# Patient Record
Sex: Male | Born: 1975 | Race: White | Hispanic: No | Marital: Married | Smoking: Former smoker
Health system: Southern US, Community
[De-identification: ages and names within clinical notes are randomized; demographics above are authoritative.]

---

## 2008-05-26 ENCOUNTER — Emergency Department: Payer: Self-pay | Admitting: Emergency Medicine

## 2008-05-28 ENCOUNTER — Emergency Department: Payer: Self-pay | Admitting: Emergency Medicine

## 2008-06-02 ENCOUNTER — Emergency Department: Payer: Self-pay | Admitting: Internal Medicine

## 2010-07-18 IMAGING — CT CT ABD-PELV W/O CM
1 of 2 series · 15 of 32 positions shown, 19 images · non-contrast
Comparison: none

REASON FOR EXAM: (1) L flank and LLQ pain; (2) L flank and LLQ pain,
stone protocol
COMMENTS:

[Series 2: stone · axial · 0.74mm/px · z∈[-1150,-662]mm · 15 of 177 slices shown, 19 images]
[im 7/177  soft-tissue]
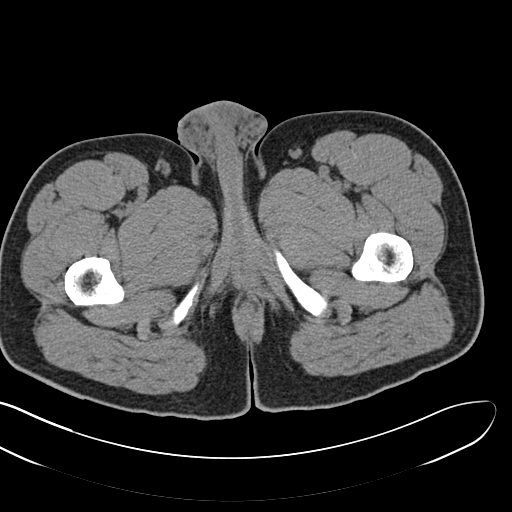
[im 7/177  bone]
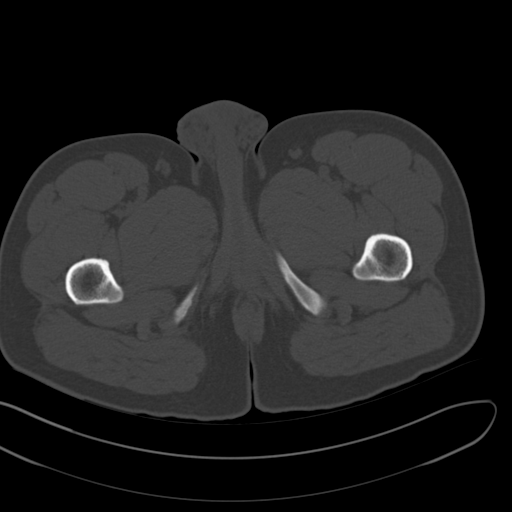
[im 21/177  soft-tissue]
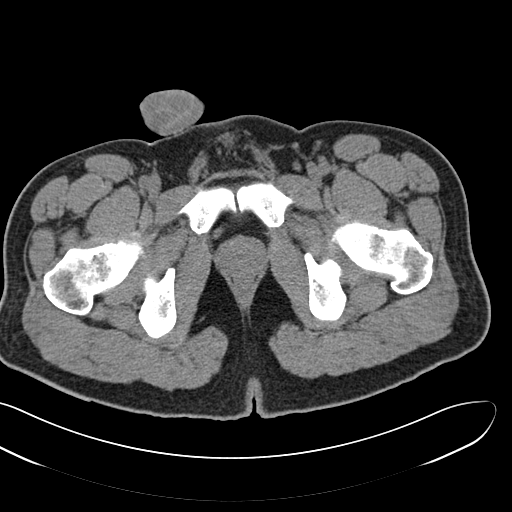
[im 34/177  soft-tissue]
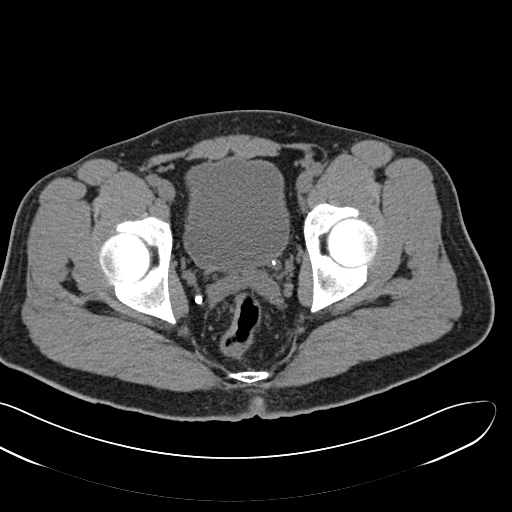
[im 48/177  soft-tissue]
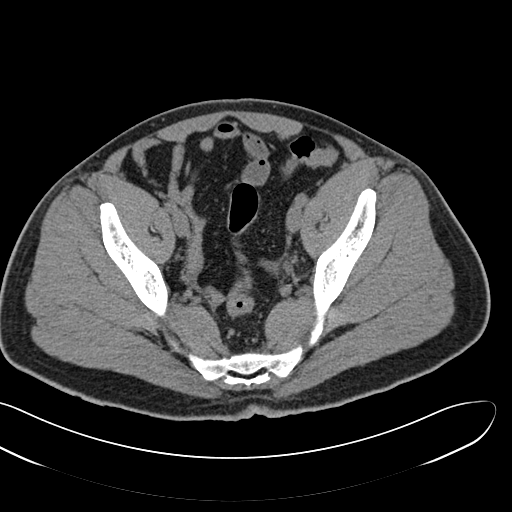
[im 61/177  soft-tissue]
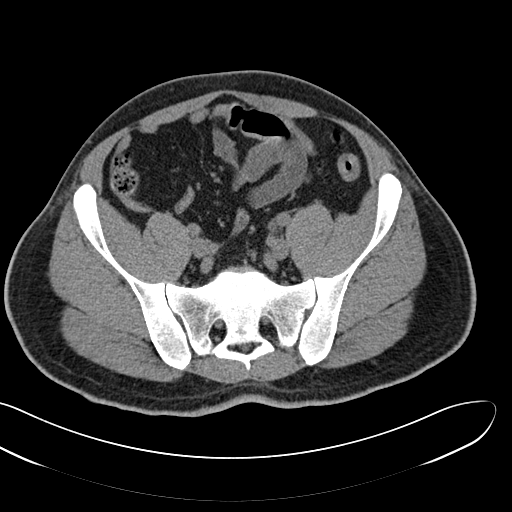
[im 75/177  soft-tissue]
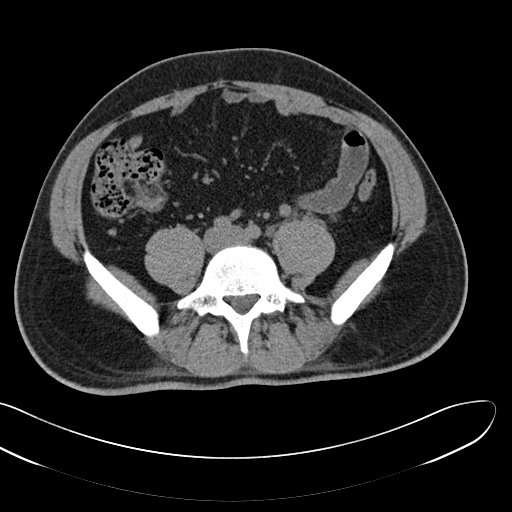
[im 89/177  soft-tissue]
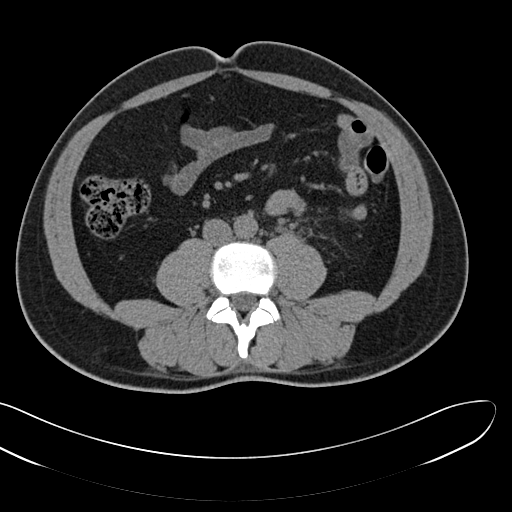
[im 102/177  soft-tissue]
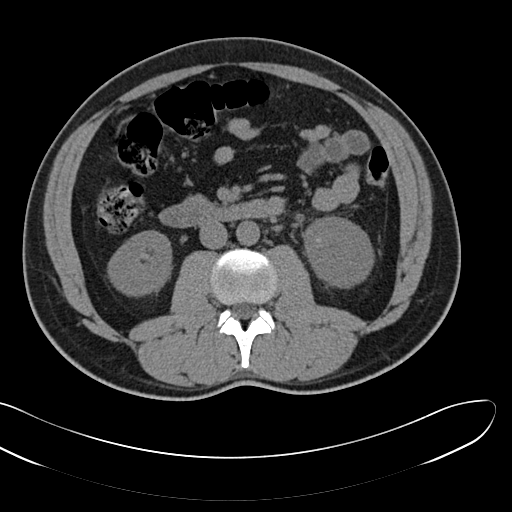
[im 116/177  soft-tissue]
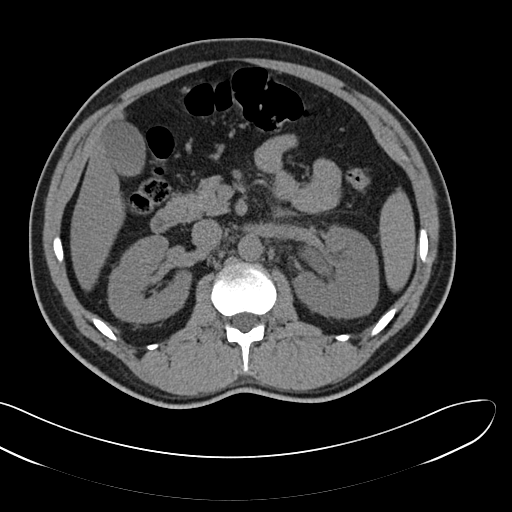
[im 116/177  bone]
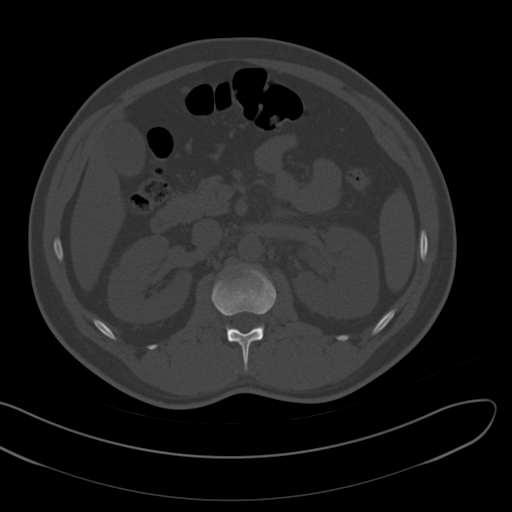
[im 129/177  soft-tissue]
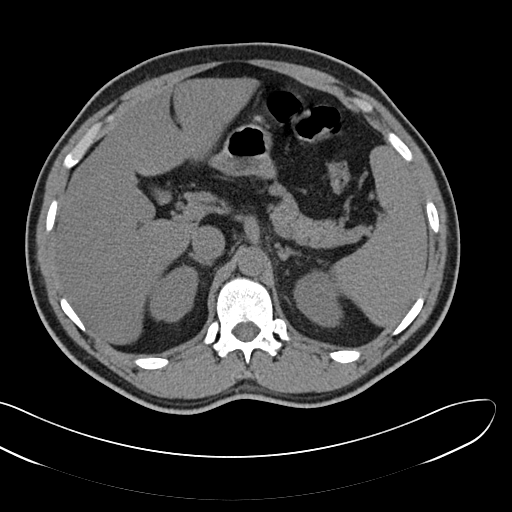
[im 143/177  soft-tissue]
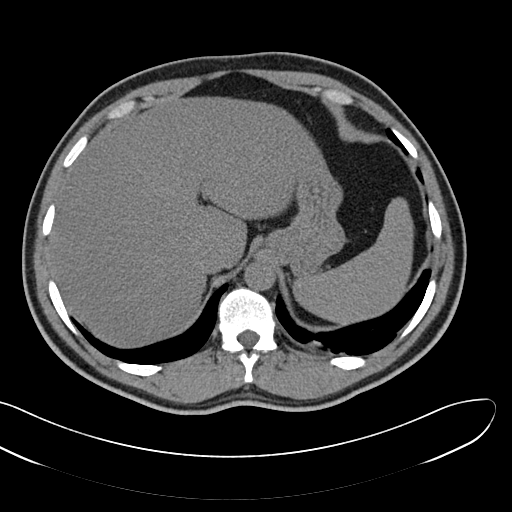
[im 149/177  lung]
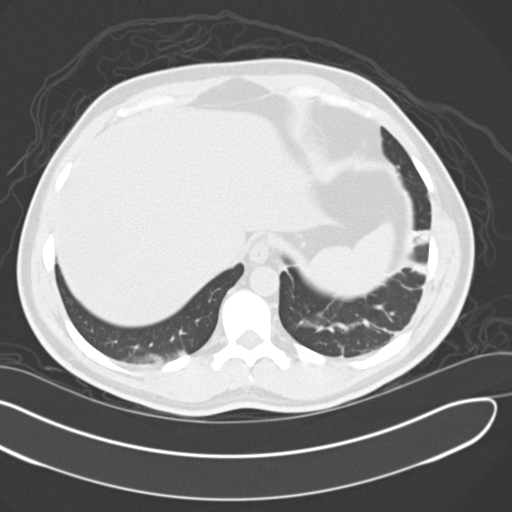
[im 156/177  soft-tissue]
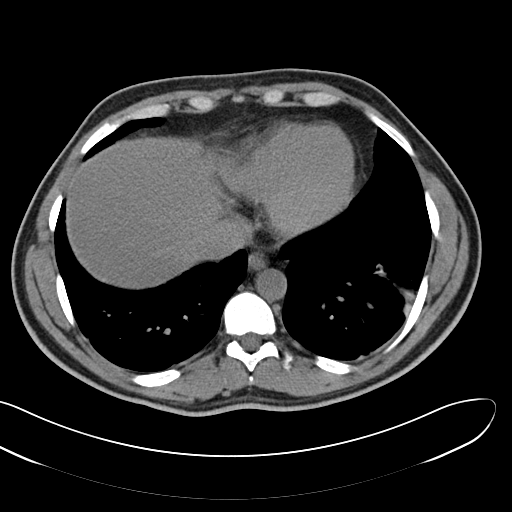
[im 156/177  lung]
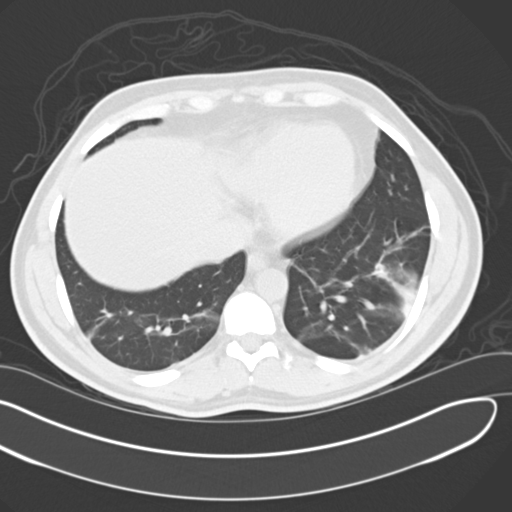
[im 163/177  lung]
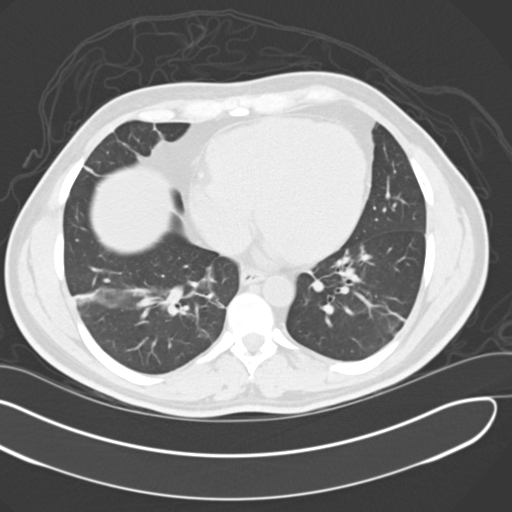
[im 170/177  soft-tissue]
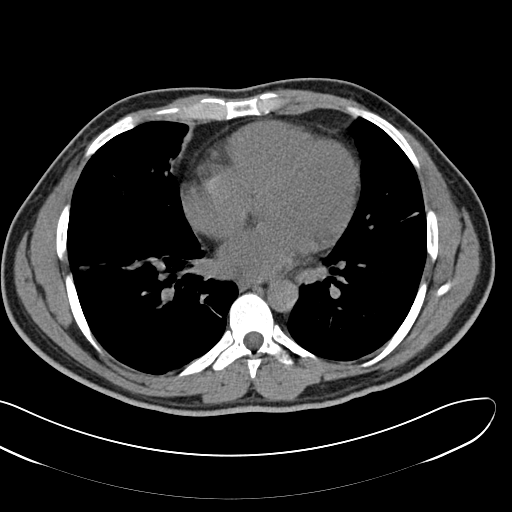
[im 170/177  lung]
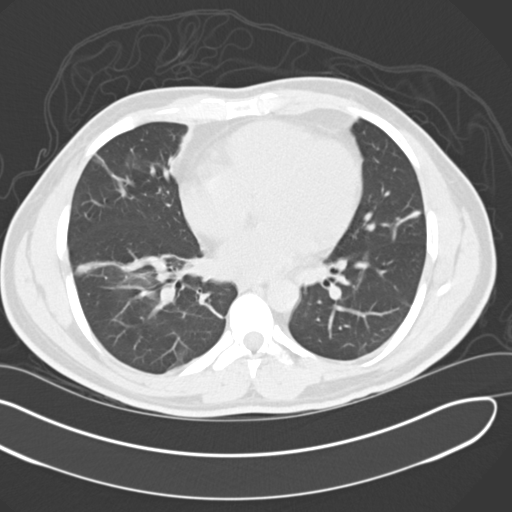

[15 of 32 positions shown; findings below may reference images not displayed]

PROCEDURE:     CT  - CT ABDOMEN AND PELVIS W[DATE]  [DATE]

RESULT:     CT of the abdomen and pelvis is performed utilizing a renal
stone protocol. There is no previous exam for comparison. Images are
reconstructed in the axial plane at 3 mm slice thickness.

On image #144 there is a 3.4 mm distal left ureteral stone at the
ureterovesical junction. There is mild hydroureter and hydronephrosis on the
left. There is perinephric stranding on the left. The left kidney is
slightly enlarged and edematous. No additional stones are evident.

No radiopaque gallstones are evident. The lung bases appear clear of mass.
There is bibasal are atelectasis or linear fibrosis. The former is felt to
be most likely. The aorta is normal in caliber. The abdominal viscera
otherwise appear to be grossly normal. There is no adenopathy.
IMPRESSION: 1. Distal left ureteral stone with obstructive changes.
2. Bibasilar areas of atelectasis.

## 2014-11-10 ENCOUNTER — Encounter: Payer: Self-pay | Admitting: Emergency Medicine

## 2014-11-10 ENCOUNTER — Ambulatory Visit
Admission: EM | Admit: 2014-11-10 | Discharge: 2014-11-10 | Disposition: A | Discharge status: Home / Self Care | Payer: Commercial Managed Care - HMO | Attending: Family Medicine | Admitting: Family Medicine

## 2014-11-10 DIAGNOSIS — Z87891 Personal history of nicotine dependence: Secondary | ICD-10-CM | POA: Diagnosis not present

## 2014-11-10 DIAGNOSIS — B084 Enteroviral vesicular stomatitis with exanthem: Secondary | ICD-10-CM | POA: Insufficient documentation

## 2014-11-10 DIAGNOSIS — R21 Rash and other nonspecific skin eruption: Secondary | ICD-10-CM | POA: Diagnosis present

## 2014-11-10 LAB — RAPID STREP SCREEN (MED CTR MEBANE ONLY): Streptococcus, Group A Screen (Direct): NEGATIVE

## 2014-11-10 NOTE — Discharge Instructions (Signed)

## 2014-11-10 NOTE — ED Provider Notes (Addendum)
CSN: [id number]     Arrival date & time 11/10/14  0826 History   First MD Initiated Contact with Patient 11/10/14 0913     Chief Complaint  Patient presents with  . Rash   (Consider location/radiation/quality/duration/timing/severity/associated sxs/prior Treatment) Patient is a 39 y.o. male presenting with rash. The history is provided by the patient. No language interpreter was used.  Rash Location:  Foot and hand Hand rash location:  L palm and R palm Severity:  Moderate Onset quality:  Gradual Duration:  3 days Progression:  Spreading Context: animal contact and exposure to similar rash   Relieved by:  Nothing Worsened by:  Nothing tried Ineffective treatments:  None tried Associated symptoms: fatigue, fever, joint pain and myalgias   Myalgias:    Location:  Generalized   Quality:  Aching   Severity:  Moderate   Onset quality:  Sudden   Duration:  4 days   Timing:  Constant   Progression:  Worsening   History reviewed. No pertinent past medical history. History reviewed. No pertinent past surgical history. History reviewed. No pertinent family history. History  Substance Use Topics  . Smoking status: Former Smoker    Types: Cigarettes    Quit date: 11/07/2011  . Smokeless tobacco: Not on file  . Alcohol Use: Yes    Review of Systems  Constitutional: Positive for fever and fatigue.  HENT:       Sore throat and irritation  Musculoskeletal: Positive for myalgias and arthralgias.  Skin: Positive for rash.    Allergies  Review of patient's allergies indicates no known allergies.  Home Medications   Prior to Admission medications   Not on File   BP 116/76 mmHg  Pulse 42  Temp(Src) 98.1 F (36.7 C) (Oral)  Resp 16  SpO2 100% Physical Exam  Constitutional: He is oriented to person, place, and time. He appears well-developed and well-nourished. No distress.  HENT:  Head: Normocephalic.  Mouth/Throat: Oropharynx is clear and moist.  Eyes: Pupils are  equal, round, and reactive to light.  Neck: Neck supple.  Neurological: He is alert and oriented to person, place, and time.  Skin: Skin is dry. Rash noted. There is erythema.     Patient has vesicles on both hands and bilateral his foot. Multiple lesions are present.  Psychiatric: He has a normal mood and affect. His behavior is normal.  Vitals reviewed.   ED Course  Procedures (including critical care time) Labs Review Labs Reviewed  RAPID STREP SCREEN (NOT AT [facility])  CULTURE, GROUP A STREP ([facility] ONLY)    Imaging Review No results found.   MDM   1. Hand, foot and mouth disease        Hassan Rowan, MD 11/10/14 1333    Should be noted that is light growth of an atypical Streptococcus organism. Attempts were made to contact patient however phone number is now someone else's number that was given to [place]. No pharmacy is listed. A letter was dictated and printed today to be mailed to the patient to contact us for Z-Pak to be called in to the pharmacy of his choice.  Hassan Rowan, MD 11/18/14 [phone]

## 2014-11-10 NOTE — ED Notes (Signed)
Pt states that he has had a rash on his hands, feet and sores in his mouth with a high fever for 2 days.

## 2014-11-12 LAB — CULTURE, GROUP A STREP (THRC)

## 2014-11-24 ENCOUNTER — Telehealth: Payer: Self-pay | Admitting: Family Medicine

## 2014-11-24 MED ORDER — AZITHROMYCIN 250 MG PO TABS: ORAL_TABLET | ORAL | Status: AC

## 2014-11-25 NOTE — ED Notes (Signed)
See updated encounter note from 11/22/2014.  Hassan Rowan, MD 11/25/14 1438
